# Patient Record
Sex: Male | Born: 1995 | Race: White | Hispanic: No | Marital: Single | Smoking: Never smoker
Health system: Southern US, Community
[De-identification: ages and names within clinical notes are randomized; demographics above are authoritative.]

## PROBLEM LIST (undated history)

## (undated) HISTORY — PX: TONSILLECTOMY: SUR1361

---
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
## 2017-05-19 ENCOUNTER — Emergency Department: Payer: No Typology Code available for payment source

## 2017-05-19 ENCOUNTER — Encounter: Payer: Self-pay | Admitting: Emergency Medicine

## 2017-05-19 ENCOUNTER — Emergency Department
Admission: EM | Admit: 2017-05-19 | Discharge: 2017-05-19 | Disposition: A | Discharge status: Home / Self Care | Payer: No Typology Code available for payment source | Attending: Emergency Medicine | Admitting: Emergency Medicine

## 2017-05-19 DIAGNOSIS — S2020XA Contusion of thorax, unspecified, initial encounter: Secondary | ICD-10-CM | POA: Insufficient documentation

## 2017-05-19 DIAGNOSIS — S7002XA Contusion of left hip, initial encounter: Secondary | ICD-10-CM | POA: Insufficient documentation

## 2017-05-19 DIAGNOSIS — Y9389 Activity, other specified: Secondary | ICD-10-CM | POA: Diagnosis not present

## 2017-05-19 DIAGNOSIS — Y999 Unspecified external cause status: Secondary | ICD-10-CM | POA: Diagnosis not present

## 2017-05-19 DIAGNOSIS — S7012XA Contusion of left thigh, initial encounter: Secondary | ICD-10-CM

## 2017-05-19 DIAGNOSIS — S0990XA Unspecified injury of head, initial encounter: Secondary | ICD-10-CM | POA: Diagnosis present

## 2017-05-19 DIAGNOSIS — S060X1A Concussion with loss of consciousness of 30 minutes or less, initial encounter: Secondary | ICD-10-CM

## 2017-05-19 DIAGNOSIS — Y92410 Unspecified street and highway as the place of occurrence of the external cause: Secondary | ICD-10-CM | POA: Diagnosis not present

## 2017-05-19 DIAGNOSIS — S20212A Contusion of left front wall of thorax, initial encounter: Secondary | ICD-10-CM

## 2017-05-19 MED ORDER — MORPHINE SULFATE (PF) 4 MG/ML IV SOLN
4.0000 mg | Freq: Once | INTRAVENOUS | Status: AC
  Administered 2017-05-19: 4 mg via INTRAVENOUS
  Filled 2017-05-19: qty 1

## 2017-05-19 MED ORDER — HYDROCODONE-ACETAMINOPHEN 5-325 MG PO TABS
1.0000 | ORAL_TABLET | ORAL | Status: AC
  Administered 2017-05-19: 1 via ORAL
  Filled 2017-05-19: qty 1

## 2017-05-19 MED ORDER — IBUPROFEN 800 MG PO TABS
800.0000 mg | ORAL_TABLET | ORAL | Status: AC
  Administered 2017-05-19: 800 mg via ORAL
  Filled 2017-05-19: qty 1

## 2017-05-19 MED ORDER — ONDANSETRON HCL 4 MG/2ML IJ SOLN
4.0000 mg | Freq: Once | INTRAMUSCULAR | Status: AC
  Administered 2017-05-19: 4 mg via INTRAVENOUS
  Filled 2017-05-19: qty 2

## 2017-05-19 MED ORDER — HYDROCODONE-ACETAMINOPHEN 5-325 MG PO TABS: 1.0000 | ORAL_TABLET | Freq: Four times a day (QID) | ORAL | 0 refills | Status: DC | PRN
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
## 2017-09-25 ENCOUNTER — Other Ambulatory Visit: Payer: Self-pay

## 2017-09-25 DIAGNOSIS — R109 Unspecified abdominal pain: Secondary | ICD-10-CM | POA: Insufficient documentation

## 2017-09-25 DIAGNOSIS — Z5321 Procedure and treatment not carried out due to patient leaving prior to being seen by health care provider: Secondary | ICD-10-CM | POA: Insufficient documentation

## 2017-09-25 LAB — CBC
HCT: 45.7 % (ref 40.0–52.0)
Hemoglobin: 15.9 g/dL (ref 13.0–18.0)
MCH: 30.7 pg (ref 26.0–34.0)
MCHC: 34.8 g/dL (ref 32.0–36.0)
MCV: 88.3 fL (ref 80.0–100.0)
PLATELETS: 297 10*3/uL (ref 150–440)
RBC: 5.17 MIL/uL (ref 4.40–5.90)
RDW: 12.6 % (ref 11.5–14.5)
WBC: 9.1 10*3/uL (ref 3.8–10.6)

## 2017-09-25 LAB — COMPREHENSIVE METABOLIC PANEL
ALT: 22 U/L (ref 17–63)
ANION GAP: 9 (ref 5–15)
AST: 26 U/L (ref 15–41)
Albumin: 4.5 g/dL (ref 3.5–5.0)
Alkaline Phosphatase: 72 U/L (ref 38–126)
BUN: 12 mg/dL (ref 6–20)
CHLORIDE: 104 mmol/L (ref 101–111)
CO2: 25 mmol/L (ref 22–32)
Calcium: 9.6 mg/dL (ref 8.9–10.3)
Creatinine, Ser: 0.9 mg/dL (ref 0.61–1.24)
GFR calc non Af Amer: >60 mL/min (ref >60)
Glucose, Bld: 113 mg/dL — ABNORMAL HIGH (ref 65–99)
POTASSIUM: 4.1 mmol/L (ref 3.5–5.1)
SODIUM: 138 mmol/L (ref 135–145)
Total Bilirubin: 1 mg/dL (ref 0.3–1.2)
Total Protein: 7.2 g/dL (ref 6.5–8.1)

## 2017-09-25 LAB — LIPASE, BLOOD: Lipase: 25 U/L (ref 11–51)

## 2017-09-25 NOTE — ED Triage Notes (Signed)
Pt in with co luq pain since last week, has had vomiting and diarrhea. Vomiting x 3 today and 1 episode of diarrhea. Pt drinking small amt of fluids due to nausea.

## 2017-09-26 ENCOUNTER — Emergency Department
Admission: EM | Admit: 2017-09-26 | Discharge: 2017-09-26 | Disposition: A | Discharge status: Home / Self Care | Payer: Self-pay | Attending: Emergency Medicine | Admitting: Emergency Medicine

## 2017-09-27 ENCOUNTER — Telehealth: Payer: Self-pay | Admitting: Emergency Medicine
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
## 2018-10-31 HISTORY — PX: MINOR OPEN REDUCTION INTERNAL FIXATION (ORIF) FINGER: SHX6684
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
## 2019-03-14 DIAGNOSIS — M79644 Pain in right finger(s): Secondary | ICD-10-CM | POA: Insufficient documentation

## 2019-03-14 DIAGNOSIS — S62639A Displaced fracture of distal phalanx of unspecified finger, initial encounter for closed fracture: Secondary | ICD-10-CM | POA: Insufficient documentation

## 2019-03-28 DIAGNOSIS — Z4789 Encounter for other orthopedic aftercare: Secondary | ICD-10-CM | POA: Insufficient documentation

## 2020-10-31 DIAGNOSIS — Z8669 Personal history of other diseases of the nervous system and sense organs: Secondary | ICD-10-CM

## 2020-10-31 HISTORY — DX: Personal history of other diseases of the nervous system and sense organs: Z86.69

## 2021-10-20 ENCOUNTER — Other Ambulatory Visit: Payer: Self-pay

## 2021-10-20 ENCOUNTER — Emergency Department
Admission: EM | Admit: 2021-10-20 | Discharge: 2021-10-20 | Disposition: A | Discharge status: Home / Self Care | Payer: Self-pay | Attending: Student in an Organized Health Care Education/Training Program | Admitting: Student in an Organized Health Care Education/Training Program

## 2021-10-20 ENCOUNTER — Encounter: Payer: Self-pay | Admitting: Emergency Medicine

## 2021-10-20 DIAGNOSIS — J069 Acute upper respiratory infection, unspecified: Secondary | ICD-10-CM | POA: Insufficient documentation

## 2021-10-20 DIAGNOSIS — R112 Nausea with vomiting, unspecified: Secondary | ICD-10-CM | POA: Insufficient documentation

## 2021-10-20 DIAGNOSIS — G510 Bell's palsy: Secondary | ICD-10-CM | POA: Insufficient documentation

## 2021-10-20 LAB — COMPREHENSIVE METABOLIC PANEL
ALT: 64 U/L — ABNORMAL HIGH (ref 0–44)
AST: 35 U/L (ref 15–41)
Albumin: 4.1 g/dL (ref 3.5–5.0)
Alkaline Phosphatase: 67 U/L (ref 38–126)
Anion gap: 6 (ref 5–15)
BUN: 14 mg/dL (ref 6–20)
CO2: 31 mmol/L (ref 22–32)
Calcium: 9.3 mg/dL (ref 8.9–10.3)
Chloride: 97 mmol/L — ABNORMAL LOW (ref 98–111)
Creatinine, Ser: 0.87 mg/dL (ref 0.61–1.24)
GFR, Estimated: >60 mL/min (ref >60)
Glucose, Bld: 94 mg/dL (ref 70–99)
Potassium: 3.9 mmol/L (ref 3.5–5.1)
Sodium: 134 mmol/L — ABNORMAL LOW (ref 135–145)
Total Bilirubin: 1.2 mg/dL (ref 0.3–1.2)
Total Protein: 7.2 g/dL (ref 6.5–8.1)

## 2021-10-20 LAB — CBC
HCT: 43.9 % (ref 39.0–52.0)
Hemoglobin: 15.3 g/dL (ref 13.0–17.0)
MCH: 29.6 pg (ref 26.0–34.0)
MCHC: 34.9 g/dL (ref 30.0–36.0)
MCV: 84.9 fL (ref 80.0–100.0)
Platelets: 270 10*3/uL (ref 150–400)
RBC: 5.17 MIL/uL (ref 4.22–5.81)
RDW: 12.8 % (ref 11.5–15.5)
WBC: 10 10*3/uL (ref 4.0–10.5)
nRBC: 0 % (ref 0.0–0.2)

## 2021-10-20 LAB — DIFFERENTIAL
Abs Immature Granulocytes: 0.09 10*3/uL — ABNORMAL HIGH (ref 0.00–0.07)
Basophils Absolute: 0 10*3/uL (ref 0.0–0.1)
Basophils Relative: 0 %
Eosinophils Absolute: 0.1 10*3/uL (ref 0.0–0.5)
Eosinophils Relative: 1 %
Immature Granulocytes: 1 %
Lymphocytes Relative: 15 %
Lymphs Abs: 1.5 10*3/uL (ref 0.7–4.0)
Monocytes Absolute: 1.1 10*3/uL — ABNORMAL HIGH (ref 0.1–1.0)
Monocytes Relative: 11 %
Neutro Abs: 7.2 10*3/uL (ref 1.7–7.7)
Neutrophils Relative %: 72 %

## 2021-10-20 MED ORDER — PREDNISONE 20 MG PO TABS: 60.0000 mg | ORAL_TABLET | Freq: Every day | ORAL | 0 refills | Status: AC

## 2021-10-20 MED ORDER — ONDANSETRON HCL 4 MG PO TABS: 4.0000 mg | ORAL_TABLET | Freq: Three times a day (TID) | ORAL | 0 refills | Status: DC | PRN

## 2021-10-20 MED ORDER — DOXYCYCLINE HYCLATE 100 MG PO TABS
100.0000 mg | ORAL_TABLET | Freq: Two times a day (BID) | ORAL | 0 refills | Status: AC
Start: 2021-10-20 — End: 2021-10-27

## 2021-10-20 NOTE — ED Triage Notes (Signed)
Pt via POV reporting right-sided facial weakness, no prior hx Bell's palsy. Also reports nausea and diarrhea x 3-4 days after recent flu (2-3 weeks ago). 4-5 episodes of diarrhea within past 24 hours. On assessment pt's face has obvious droop to right side. No other right-sided deficits.

## 2021-10-20 NOTE — ED Provider Notes (Signed)
[facility] Emergency Department Provider Note    Event Date/Time   First MD Initiated Contact with Patient 10/20/21 1523     (approximate)  I have reviewed the triage vital signs and the nursing notes.   HISTORY  Chief Complaint Facial Droop    HPI Freedom Brach is a 25 y.o. male is previously healthy presents to the ER after recent viral illness including flu symptoms past week now having worsening symptoms of sinusitis and noted in the past day or 2.  That the right side of his face was and is developed a right facial droop.  Denies any other numbness or tingling.  Also has some tingling sensation on the right side of his tongue.  Denies any trauma.  No sudden onset headache.  Did receive course of Tamiflu.  Denies any cough or shortness of breath.  Has had some nausea and vomiting but has been tolerating Pedialyte.  History reviewed. No pertinent past medical history. History reviewed. No pertinent family history. Past Surgical History:  Procedure Laterality Date   TONSILLECTOMY     There are no problems to display for this patient.     Prior to Admission medications   Medication Sig Start Date End Date Taking? Authorizing Provider  doxycycline (VIBRA-TABS) 100 MG tablet Take 1 tablet (100 mg total) by mouth 2 (two) times daily for 7 days. 10/20/21 10/27/21 Yes Willy Eddy, MD  ondansetron (ZOFRAN) 4 MG tablet Take 1 tablet (4 mg total) by mouth every 8 (eight) hours as needed for nausea or vomiting. 10/20/21  Yes Willy Eddy, MD  predniSONE (DELTASONE) 20 MG tablet Take 3 tablets (60 mg total) by mouth daily for 5 days. Take 40mg  on day 6,7.  Take 20 mg on day 8 10/20/21 10/25/21 Yes 10/27/21, MD  HYDROcodone-acetaminophen (NORCO/VICODIN) 5-325 MG tablet Take 1 tablet by mouth every 6 (six) hours as needed for moderate pain. 05/19/17   05/21/17, MD    Allergies Patient has no known allergies.    Social History Social  History   Tobacco Use   Smoking status: Never   Smokeless tobacco: Never  Substance Use Topics   Alcohol use: No    Review of Systems Patient denies headaches, rhinorrhea, blurry vision, numbness, shortness of breath, chest pain, edema, cough, abdominal pain, nausea, vomiting, diarrhea, dysuria, fevers, rashes or hallucinations unless otherwise stated above in HPI. ____________________________________________   PHYSICAL EXAM:  VITAL SIGNS: Vitals:   10/20/21 1336  BP: (!) 132/93  Pulse: 77  Resp: 14  Temp: 97.8 F (36.6 C)  SpO2: 98%    Constitutional: Alert and oriented.  Eyes: Conjunctivae are normal.  Head: Atraumatic. Nose: No congestion/rhinnorhea. Mouth/Throat: Mucous membranes are moist.   Neck: No stridor. Painless ROM.  Cardiovascular: Normal rate, regular rhythm. Grossly normal heart sounds.  Good peripheral circulation. Respiratory: Normal respiratory effort.  No retractions. Lungs CTAB. Gastrointestinal: Soft and nontender. No distention. No abdominal bruits. No CVA tenderness. Genitourinary:  Musculoskeletal: No lower extremity tenderness nor edema.  No joint effusions. Neurologic:  Normal speech and language.  Complete facial nerve palsy involving the forehead consistent with Bell's. no other gross focal neurologic deficits are appreciated.  Skin:  Skin is warm, dry and intact. No rash noted. Psychiatric: Mood and affect are normal. Speech and behavior are normal.  ____________________________________________   LABS (all labs ordered are listed, but only abnormal results are displayed)  Results for orders placed or performed during the hospital encounter of 10/20/21 (from  the past 24 hour(s))  CBC     Status: None   Collection Time: 10/20/21  1:50 PM  Result Value Ref Range   WBC 10.0 4.0 - 10.5 K/uL   RBC 5.17 4.22 - 5.81 MIL/uL   Hemoglobin 15.3 13.0 - 17.0 g/dL   HCT 24.0 97.3 - 53.2 %   MCV 84.9 80.0 - 100.0 fL   MCH 29.6 26.0 - 34.0 pg    MCHC 34.9 30.0 - 36.0 g/dL   RDW 99.2 42.6 - 83.4 %   Platelets 270 150 - 400 K/uL   nRBC 0.0 0.0 - 0.2 %  Differential     Status: Abnormal   Collection Time: 10/20/21  1:50 PM  Result Value Ref Range   Neutrophils Relative % 72 %   Neutro Abs 7.2 1.7 - 7.7 K/uL   Lymphocytes Relative 15 %   Lymphs Abs 1.5 0.7 - 4.0 K/uL   Monocytes Relative 11 %   Monocytes Absolute 1.1 (H) 0.1 - 1.0 K/uL   Eosinophils Relative 1 %   Eosinophils Absolute 0.1 0.0 - 0.5 K/uL   Basophils Relative 0 %   Basophils Absolute 0.0 0.0 - 0.1 K/uL   Immature Granulocytes 1 %   Abs Immature Granulocytes 0.09 (H) 0.00 - 0.07 K/uL  Comprehensive metabolic panel     Status: Abnormal   Collection Time: 10/20/21  1:50 PM  Result Value Ref Range   Sodium 134 (L) 135 - 145 mmol/L   Potassium 3.9 3.5 - 5.1 mmol/L   Chloride 97 (L) 98 - 111 mmol/L   CO2 31 22 - 32 mmol/L   Glucose, Bld 94 70 - 99 mg/dL   BUN 14 6 - 20 mg/dL   Creatinine, Ser 1.96 0.61 - 1.24 mg/dL   Calcium 9.3 8.9 - 22.2 mg/dL   Total Protein 7.2 6.5 - 8.1 g/dL   Albumin 4.1 3.5 - 5.0 g/dL   AST 35 15 - 41 U/L   ALT 64 (H) 0 - 44 U/L   Alkaline Phosphatase 67 38 - 126 U/L   Total Bilirubin 1.2 0.3 - 1.2 mg/dL   GFR, Estimated >97 >98 mL/min   Anion gap 6 5 - 15   ____________________________________________  EKG My review and personal interpretation at Time: 13:54   Indication: facial droop  Rate: 80  Rhythm: sinus Axis: normal Other: no stemi, nonspecific st abn ____________________________________________  RADIOLOGY  I personally reviewed all radiographic images ordered to evaluate for the above acute complaints and reviewed radiology reports and findings.  These findings were personally discussed with the patient.  Please see medical record for radiology report.  ____________________________________________   PROCEDURES  Procedure(s) performed:  Procedures    Critical Care performed:  no ____________________________________________   INITIAL IMPRESSION / ASSESSMENT AND PLAN / ED COURSE  Pertinent labs & imaging results that were available during my care of the patient were reviewed by me and considered in my medical decision making (see chart for details).   DDX: Bell's palsy, sinusitis, URI, mass, Lyme, CVA   Cory Davidson is a 25 y.o. who presents to the ED with presentation consistent with Bell's palsy.  Remainder of exam and work-up is reassuring.  Do not feel that neuroimaging clinically indicated given history and presentation.  Is not consistent with CVA or mass.  While patient reports nausea and vomiting he states that he feels well right now and disc declined IV fluids.  Discussed conservative management with this diagnosis and signs and symptoms  for which he should return to the ER.  Patient agreeable to plan.     The patient was evaluated in Emergency Department today for the symptoms described in the history of present illness. He/she was evaluated in the context of the global COVID-19 pandemic, which necessitated consideration that the patient might be at risk for infection with the SARS-CoV-2 virus that causes COVID-19. Institutional protocols and algorithms that pertain to the evaluation of patients at risk for COVID-19 are in a state of rapid change based on information released by regulatory bodies including the CDC and federal and state organizations. These policies and algorithms were followed during the patient's care in the ED.  As part of my medical decision making, I reviewed the following data within the electronic MEDICAL RECORD NUMBER Nursing notes reviewed and incorporated, Labs reviewed, notes from prior ED visits and [place] Controlled Substance Database   ____________________________________________   FINAL CLINICAL IMPRESSION(S) / ED DIAGNOSES  Final diagnoses:  Upper respiratory tract infection, unspecified type  Bell's palsy      NEW  MEDICATIONS STARTED DURING THIS VISIT:  New Prescriptions   DOXYCYCLINE (VIBRA-TABS) 100 MG TABLET    Take 1 tablet (100 mg total) by mouth 2 (two) times daily for 7 days.   ONDANSETRON (ZOFRAN) 4 MG TABLET    Take 1 tablet (4 mg total) by mouth every 8 (eight) hours as needed for nausea or vomiting.   PREDNISONE (DELTASONE) 20 MG TABLET    Take 3 tablets (60 mg total) by mouth daily for 5 days. Take 40mg  on day 6,7.  Take 20 mg on day 8     Note:  This document was prepared using Dragon voice recognition software and may include unintentional dictation errors.    , MD 10/20/21 [phone]

## 2021-10-20 NOTE — ED Notes (Signed)
Light green, dark green, and lavender sent to lab

## 2022-10-05 ENCOUNTER — Encounter: Payer: Self-pay | Admitting: Physician Assistant

## 2022-10-05 ENCOUNTER — Ambulatory Visit: Payer: Self-pay

## 2022-10-05 ENCOUNTER — Ambulatory Visit: Payer: Self-pay | Admitting: Physician Assistant

## 2022-10-05 VITALS — BP 138/84 | HR 77 | Temp 98.2°F | Resp 12 | Ht 71.0 in | Wt 210.0 lb

## 2022-10-05 DIAGNOSIS — Z021 Encounter for pre-employment examination: Secondary | ICD-10-CM

## 2022-10-05 LAB — POCT URINALYSIS DIPSTICK
Bilirubin, UA: NEGATIVE
Blood, UA: NEGATIVE
Glucose, UA: NEGATIVE
Ketones, UA: NEGATIVE
Leukocytes, UA: NEGATIVE
Nitrite, UA: NEGATIVE
Protein, UA: NEGATIVE
Spec Grav, UA: >=1.03 — AB (ref 1.010–1.025)
Urobilinogen, UA: 0.2 E.U./dL
pH, UA: 6 (ref 5.0–8.0)

## 2022-10-05 NOTE — Progress Notes (Signed)
   [employer] occupational health clinic   ____________________________________________   None    (approximate)  I have reviewed the triage vital signs and the nursing notes.   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 male patient presents for employment physical exam for [employer].  Patient voiced no concerns or complaints.         Past Medical History:  Diagnosis Date   History of Bell's palsy 2022    Patient Active Problem List   Diagnosis Date Noted   Encounter for orthopedic follow-up care 03/28/2019   Closed fracture of distal phalanx of finger 03/14/2019   Pain in finger of right hand 03/14/2019    Past Surgical History:  Procedure Laterality Date   MINOR OPEN REDUCTION INTERNAL FIXATION (ORIF) FINGER Right 2020   ring finger   TONSILLECTOMY      Prior to Admission medications   Not on File    Allergies Patient has no known allergies.  Family History  Problem Relation Age of Onset   Leukemia Mother    Asthma Maternal Grandmother    Emphysema Maternal Grandmother    Heart failure Maternal Grandfather     Social History Social History   Tobacco Use   Smoking status: Never    Passive exposure: Never   Smokeless tobacco: Never  Substance Use Topics   Alcohol use: Yes    Review of Systems Constitutional: No fever/chills Eyes: No visual changes. ENT: No sore throat. Cardiovascular: Denies chest pain. Respiratory: Denies shortness of breath. Gastrointestinal: No abdominal pain.  No nausea, no vomiting.  No diarrhea.  No constipation. Genitourinary: Negative for dysuria. Musculoskeletal: Negative for back pain. Skin: Negative for rash. Neurological: Negative for headaches, focal weakness or numbness.  ____________________________________________   PHYSICAL EXAM:  VITAL SIGNS: BP is 141/85, pulse 78, respiration 12, temperature 98.2,  and patient 90% O2 sat on room air.  Patient weighs 210 pounds BMI is 29.29. Constitutional: Alert and oriented. Well appearing and in no acute distress. Eyes: Conjunctivae are normal. PERRL. EOMI. Head: Atraumatic. Nose: No congestion/rhinnorhea. Mouth/Throat: Mucous membranes are moist.  Oropharynx non-erythematous. Neck: No stridor. No cervical spine tenderness to palpation. Hematological/Lymphatic/Immunilogical: No cervical lymphadenopathy. Cardiovascular: Normal rate, regular rhythm. Grossly normal heart sounds.  Good peripheral circulation. Respiratory: Normal respiratory effort.  No retractions. Lungs CTAB. Gastrointestinal: Soft and nontender. No distention. No abdominal bruits. No CVA tenderness. Genitourinary: Deferred Musculoskeletal: No lower extremity tenderness nor edema.  No joint effusions. Neurologic:  Normal speech and language. No gross focal neurologic deficits are appreciated. No gait instability. Skin:  Skin is warm, dry and intact. No rash noted. Psychiatric: Mood and affect are normal. Speech and behavior are normal.  ____________________________________________   LABS Pending  ____________________________________________    ____________________________________________   INITIAL IMPRESSION / ASSESSMENT AND PLAN / ED COURSE  As part of my medical decision making, I reviewed the following data within the electronic MEDICAL RECORD NUMBER      Discussed no acute findings on physical exam.  Advise lab results are pending.        ____________________________________________   FINAL CLINICAL IMPRESSION Well exam  ED Discharge Orders     None        Note:  This document was prepared using Dragon voice recognition software and may include unintentional dictation errors.

## 2022-10-05 NOTE — Progress Notes (Signed)
Pt presents today for pre-employment UDS, labs and BLET physical. /CL,MA 

## 2022-10-05 NOTE — Progress Notes (Signed)
Pt presents today to complete Pre-employment BLET physical, denies any issues or concerns at this time./CL,RMA 

## 2022-10-07 LAB — CMP12+LP+TP+TSH+6AC+CBC/D/PLT
ALT: 34 IU/L (ref 0–44)
AST: 23 IU/L (ref 0–40)
Albumin/Globulin Ratio: 2.2 (ref 1.2–2.2)
Albumin: 4.8 g/dL (ref 4.3–5.2)
Alkaline Phosphatase: 77 IU/L (ref 44–121)
BUN/Creatinine Ratio: 15 (ref 9–20)
BUN: 13 mg/dL (ref 6–20)
Basophils Absolute: 0 10*3/uL (ref 0.0–0.2)
Basos: 0 %
Bilirubin Total: 0.6 mg/dL (ref 0.0–1.2)
Calcium: 10 mg/dL (ref 8.7–10.2)
Chloride: 103 mmol/L (ref 96–106)
Chol/HDL Ratio: 4.1 ratio (ref 0.0–5.0)
Cholesterol, Total: 114 mg/dL (ref 100–199)
Creatinine, Ser: 0.89 mg/dL (ref 0.76–1.27)
EOS (ABSOLUTE): 0.1 10*3/uL (ref 0.0–0.4)
Eos: 1 %
Estimated CHD Risk: 0.8 times avg. (ref 0.0–1.0)
Free Thyroxine Index: 2.4 (ref 1.2–4.9)
GGT: 26 IU/L (ref 0–65)
Globulin, Total: 2.2 g/dL (ref 1.5–4.5)
Glucose: 71 mg/dL (ref 70–99)
HDL: 28 mg/dL — ABNORMAL LOW (ref >39)
Hematocrit: 45.6 % (ref 37.5–51.0)
Hemoglobin: 15.5 g/dL (ref 13.0–17.7)
Immature Grans (Abs): 0 10*3/uL (ref 0.0–0.1)
Immature Granulocytes: 0 %
Iron: 67 ug/dL (ref 38–169)
LDH: 176 IU/L (ref 121–224)
LDL Chol Calc (NIH): 57 mg/dL (ref 0–99)
Lymphocytes Absolute: 2.3 10*3/uL (ref 0.7–3.1)
Lymphs: 23 %
MCH: 29.1 pg (ref 26.6–33.0)
MCHC: 34 g/dL (ref 31.5–35.7)
MCV: 86 fL (ref 79–97)
Monocytes Absolute: 1.1 10*3/uL — ABNORMAL HIGH (ref 0.1–0.9)
Monocytes: 11 %
Neutrophils Absolute: 6.4 10*3/uL (ref 1.4–7.0)
Neutrophils: 65 %
Phosphorus: 3.2 mg/dL (ref 2.8–4.1)
Platelets: 319 10*3/uL (ref 150–450)
Potassium: 4.4 mmol/L (ref 3.5–5.2)
RBC: 5.33 x10E6/uL (ref 4.14–5.80)
RDW: 12.5 % (ref 11.6–15.4)
Sodium: 142 mmol/L (ref 134–144)
T3 Uptake Ratio: 27 % (ref 24–39)
T4, Total: 9 ug/dL (ref 4.5–12.0)
TSH: 0.926 u[IU]/mL (ref 0.450–4.500)
Total Protein: 7 g/dL (ref 6.0–8.5)
Triglycerides: 169 mg/dL — ABNORMAL HIGH (ref 0–149)
Uric Acid: 7 mg/dL (ref 3.8–8.4)
VLDL Cholesterol Cal: 29 mg/dL (ref 5–40)
WBC: 9.9 10*3/uL (ref 3.4–10.8)
eGFR: 123 mL/min/{1.73_m2} (ref >59)

## 2022-10-07 LAB — QUANTIFERON-TB GOLD PLUS
QuantiFERON Mitogen Value: >10 IU/mL
QuantiFERON Nil Value: 0 IU/mL
QuantiFERON TB1 Ag Value: 0 IU/mL
QuantiFERON TB2 Ag Value: 0 IU/mL
QuantiFERON-TB Gold Plus: NEGATIVE

## 2022-10-07 LAB — HEPATITIS B SURFACE ANTIBODY,QUALITATIVE: Hep B Surface Ab, Qual: NONREACTIVE

## 2023-03-17 ENCOUNTER — Other Ambulatory Visit: Payer: Self-pay

## 2023-03-17 DIAGNOSIS — Z021 Encounter for pre-employment examination: Secondary | ICD-10-CM

## 2023-03-17 NOTE — Progress Notes (Signed)
Completed BLET program.  Repeat Pre-employment drug screen - needs to be within 60 days of hire.  AMD
